# Patient Record
Sex: Female | Born: 1995 | Race: Black or African American | Hispanic: No | Marital: Single
Health system: Southern US, Community
[De-identification: ages and names within clinical notes are randomized; demographics above are authoritative.]

---

## 2020-09-01 ENCOUNTER — Emergency Department: Payer: Self-pay

## 2020-09-01 ENCOUNTER — Encounter: Payer: Self-pay | Admitting: Emergency Medicine

## 2020-09-01 ENCOUNTER — Emergency Department
Admission: EM | Admit: 2020-09-01 | Discharge: 2020-09-01 | Disposition: A | Discharge status: Home / Self Care | Payer: Self-pay | Attending: Emergency Medicine | Admitting: Emergency Medicine

## 2020-09-01 ENCOUNTER — Other Ambulatory Visit: Payer: Self-pay

## 2020-09-01 DIAGNOSIS — Y92410 Unspecified street and highway as the place of occurrence of the external cause: Secondary | ICD-10-CM | POA: Diagnosis not present

## 2020-09-01 DIAGNOSIS — S91111A Laceration without foreign body of right great toe without damage to nail, initial encounter: Secondary | ICD-10-CM | POA: Insufficient documentation

## 2020-09-01 DIAGNOSIS — Z23 Encounter for immunization: Secondary | ICD-10-CM | POA: Insufficient documentation

## 2020-09-01 MED ORDER — TETANUS-DIPHTH-ACELL PERTUSSIS 5-2.5-18.5 LF-MCG/0.5 IM SUSP
0.5000 mL | Freq: Once | INTRAMUSCULAR | Status: AC
  Administered 2020-09-01: 0.5 mL via INTRAMUSCULAR
  Filled 2020-09-01: qty 0.5

## 2020-09-01 MED ORDER — IBUPROFEN 600 MG PO TABS: 600.0000 mg | ORAL_TABLET | Freq: Four times a day (QID) | ORAL | 0 refills | Status: AC | PRN

## 2020-09-01 MED ORDER — BACLOFEN 5 MG PO TABS: 5.0000 mg | ORAL_TABLET | Freq: Three times a day (TID) | ORAL | 0 refills | Status: AC | PRN

## 2020-09-01 NOTE — ED Notes (Signed)
Patient's right great toe was cleaned with soap and water and covered with Bandaid. Patient tolerated procedure well.
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
## 2020-11-26 ENCOUNTER — Encounter (HOSPITAL_COMMUNITY): Payer: Self-pay

## 2020-11-26 ENCOUNTER — Other Ambulatory Visit: Payer: Self-pay

## 2020-11-26 ENCOUNTER — Emergency Department (HOSPITAL_COMMUNITY): Payer: Self-pay

## 2020-11-26 DIAGNOSIS — R0781 Pleurodynia: Secondary | ICD-10-CM | POA: Insufficient documentation

## 2020-11-26 DIAGNOSIS — Y92410 Unspecified street and highway as the place of occurrence of the external cause: Secondary | ICD-10-CM | POA: Insufficient documentation

## 2020-11-26 DIAGNOSIS — S4992XA Unspecified injury of left shoulder and upper arm, initial encounter: Secondary | ICD-10-CM | POA: Diagnosis present

## 2020-11-26 DIAGNOSIS — S40212A Abrasion of left shoulder, initial encounter: Secondary | ICD-10-CM | POA: Diagnosis not present
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
## 2020-11-27 ENCOUNTER — Emergency Department (HOSPITAL_COMMUNITY)
Admission: EM | Admit: 2020-11-27 | Discharge: 2020-11-27 | Disposition: A | Discharge status: Home / Self Care | Payer: Self-pay | Attending: Emergency Medicine | Admitting: Emergency Medicine

## 2020-11-27 ENCOUNTER — Emergency Department (HOSPITAL_COMMUNITY): Payer: Self-pay

## 2020-11-27 DIAGNOSIS — M79601 Pain in right arm: Secondary | ICD-10-CM

## 2020-11-27 DIAGNOSIS — R0781 Pleurodynia: Secondary | ICD-10-CM

## 2020-11-27 MED ORDER — CYCLOBENZAPRINE HCL 5 MG PO TABS: 5.0000 mg | ORAL_TABLET | Freq: Three times a day (TID) | ORAL | 0 refills | Status: DC | PRN

## 2020-11-27 MED ORDER — CYCLOBENZAPRINE HCL 5 MG PO TABS: 5.0000 mg | ORAL_TABLET | Freq: Three times a day (TID) | ORAL | 0 refills | Status: AC | PRN

## 2020-11-27 NOTE — ED Notes (Signed)
Pt reports Xrays were completed w/o pregnancy test.  Will get ordered d/c'd.

## 2020-11-27 NOTE — Discharge Instructions (Signed)
You have been seen and discharged from the emergency department.  Follow-up with your primary provider for reevaluation. Take new prescriptions and home medications as prescribed.  Do not mix muscle relaxer with alcohol.  Do not do heavy activity or drive after taking this medication until you know how it affects you as this can cause you to feel drowsy.  If you have any worsening symptoms or further concerns for health please return to an emergency department for further evaluation.
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
## 2020-11-27 NOTE — ED Notes (Signed)
Pt escorted to discharge window. Verbalized understanding discharge instructions, prescriptions, and follow-up. In no acute distress.   
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
## 2022-02-15 IMAGING — CR DG HUMERUS 2V *R*
3 series · 3 of 3 positions shown · non-contrast
Comparison: None.

CLINICAL DATA: Diffuse upper arm pain

EXAM:
RIGHT HUMERUS - 2+ VIEW

[w humerus ap right (1 of 2)]
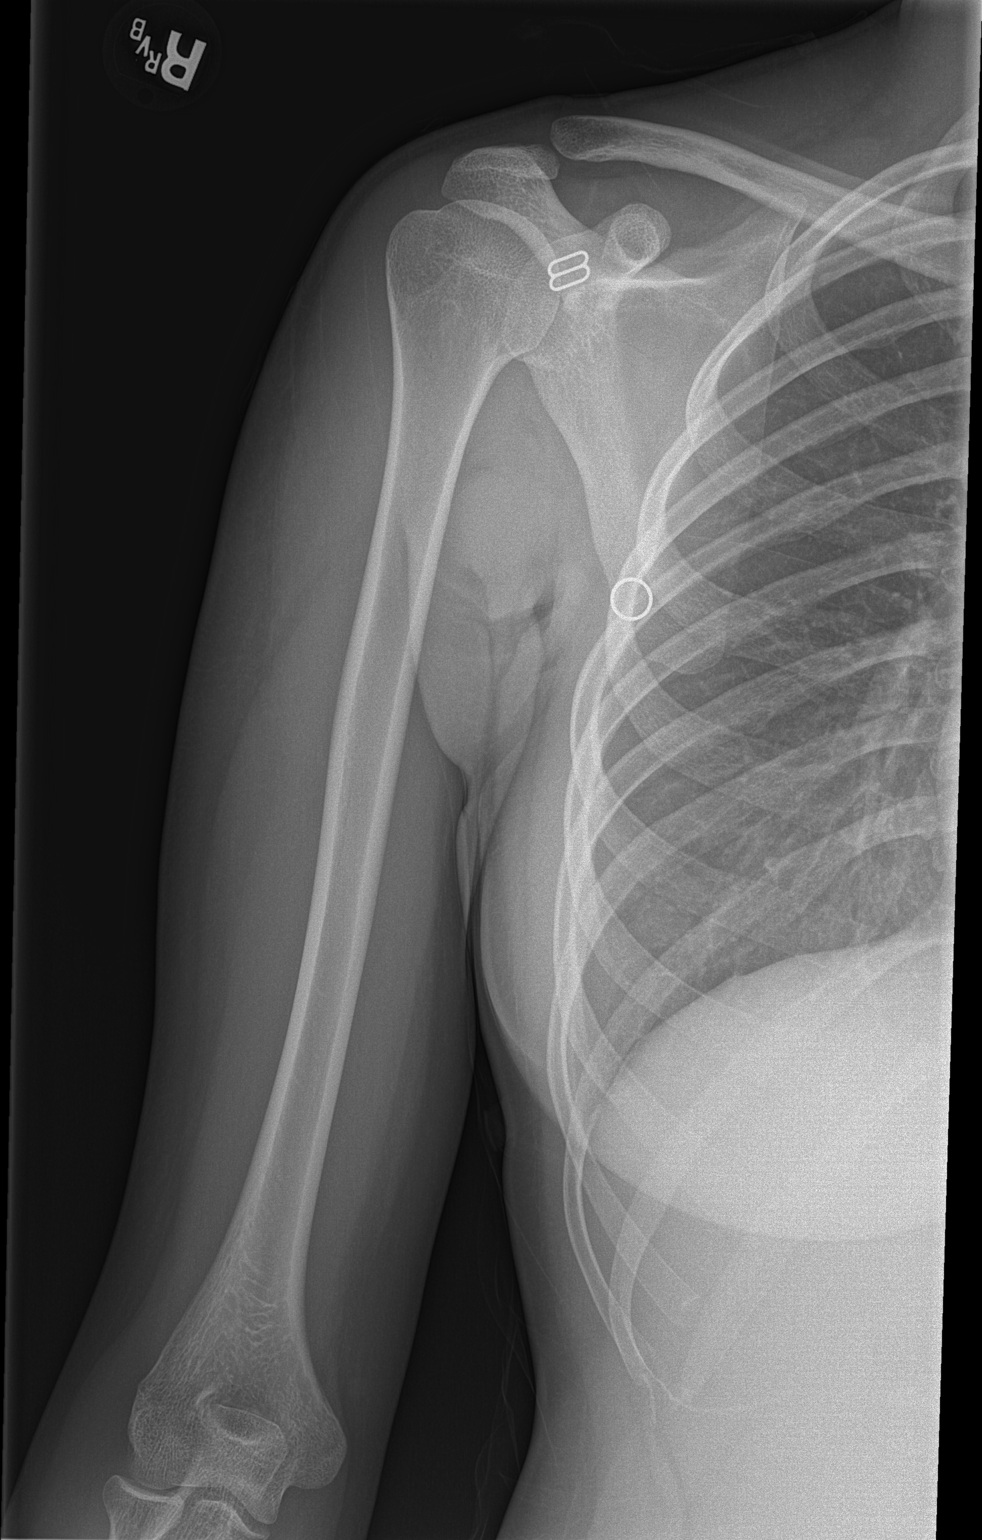

[w humerus ap right (2 of 2)]
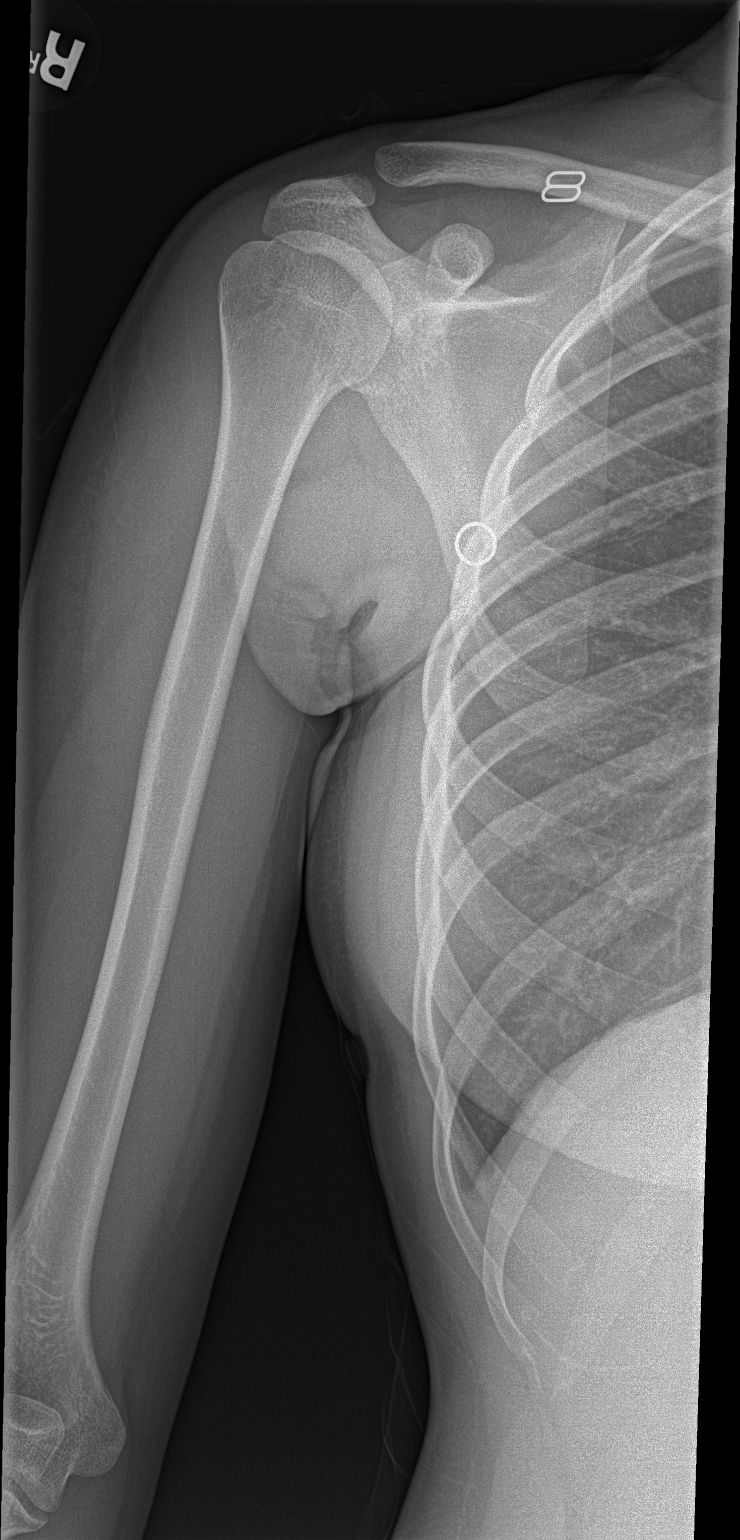

[w humerus lat right]
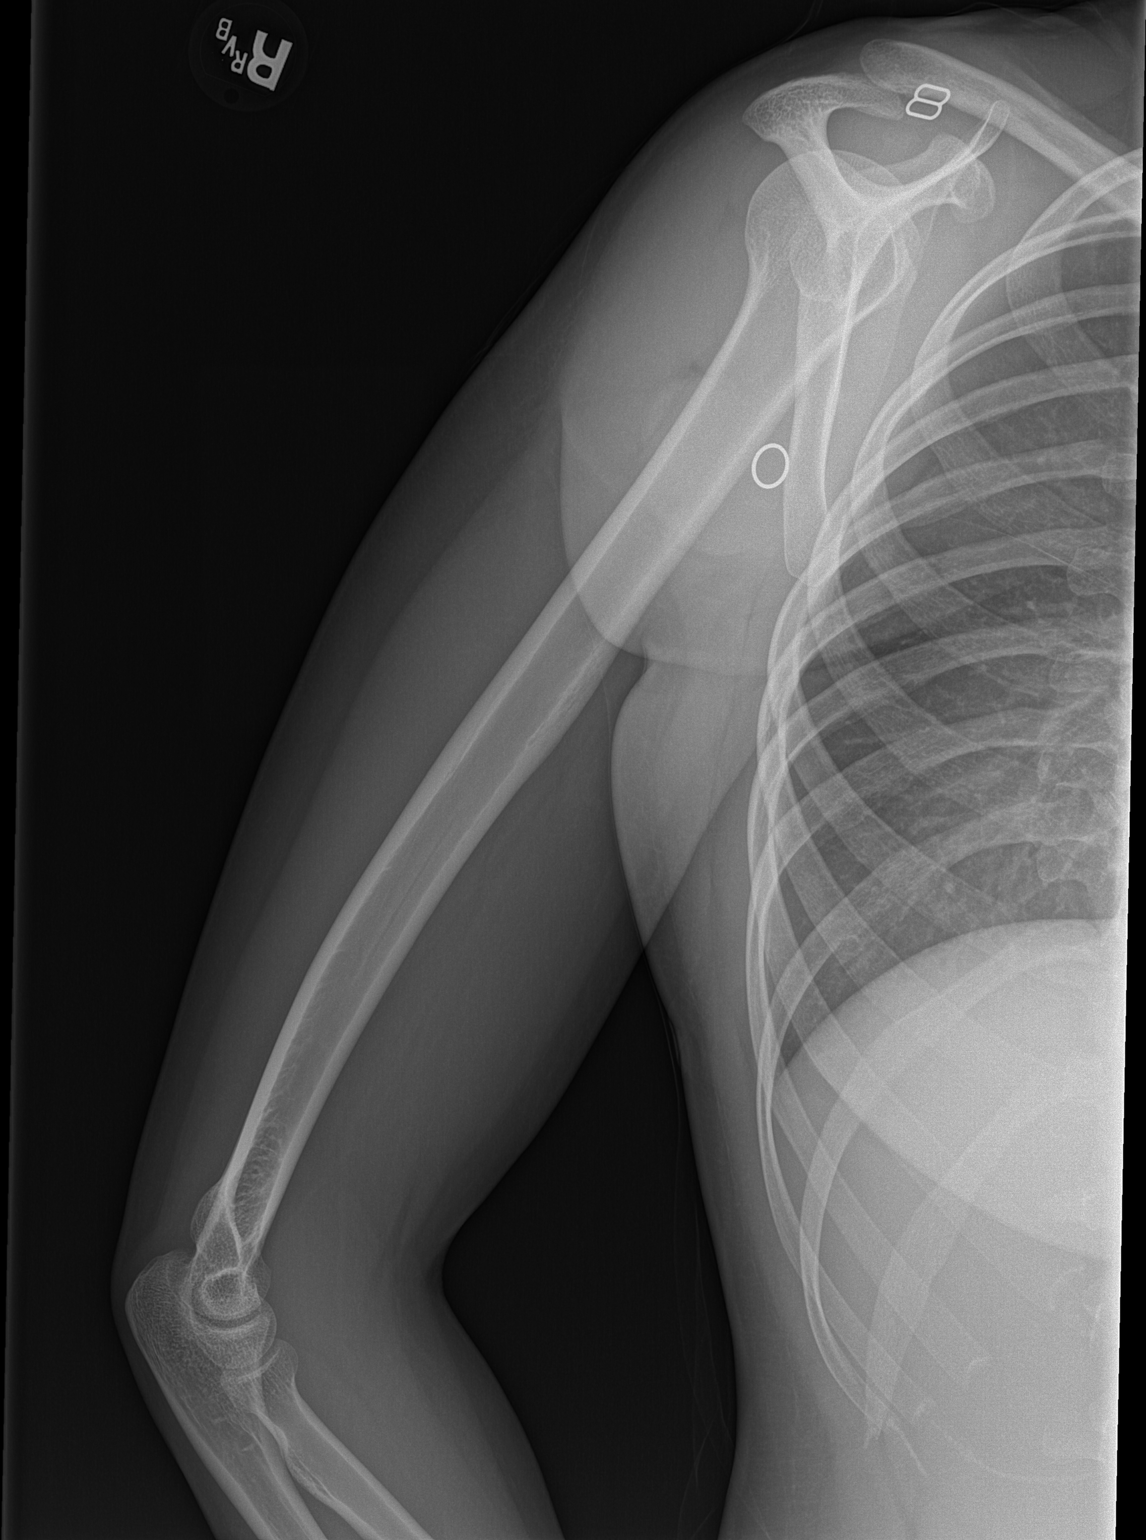

[3 of 3 positions shown; findings below may reference images not displayed]

FINDINGS: There is no evidence of fracture or other focal bone lesions. Soft
tissues are unremarkable.
IMPRESSION: Negative.
